# Patient Record
Sex: Male | Born: 1985 | Hispanic: Yes | Marital: Single
Health system: Southern US, Community
[De-identification: ages and names within clinical notes are randomized; demographics above are authoritative.]

---

## 2020-11-07 ENCOUNTER — Emergency Department (HOSPITAL_COMMUNITY)
Admission: EM | Admit: 2020-11-07 | Discharge: 2020-11-07 | Disposition: A | Discharge status: Home / Self Care | Payer: Self-pay | Attending: Emergency Medicine | Admitting: Emergency Medicine

## 2020-11-07 ENCOUNTER — Emergency Department (HOSPITAL_COMMUNITY): Payer: Self-pay

## 2020-11-07 ENCOUNTER — Other Ambulatory Visit: Payer: Self-pay

## 2020-11-07 DIAGNOSIS — W208XXA Other cause of strike by thrown, projected or falling object, initial encounter: Secondary | ICD-10-CM | POA: Insufficient documentation

## 2020-11-07 DIAGNOSIS — S99922A Unspecified injury of left foot, initial encounter: Secondary | ICD-10-CM | POA: Insufficient documentation

## 2020-11-07 DIAGNOSIS — M79672 Pain in left foot: Secondary | ICD-10-CM

## 2020-11-07 DIAGNOSIS — S99929A Unspecified injury of unspecified foot, initial encounter: Secondary | ICD-10-CM

## 2020-11-07 MED ORDER — NAPROXEN 375 MG PO TABS: 375.0000 mg | ORAL_TABLET | Freq: Two times a day (BID) | ORAL | 0 refills | Status: AC

## 2020-11-07 MED ORDER — OXYCODONE-ACETAMINOPHEN 5-325 MG PO TABS
1.0000 | ORAL_TABLET | Freq: Once | ORAL | Status: AC
  Administered 2020-11-07: 1 via ORAL
  Filled 2020-11-07: qty 1

## 2020-11-07 NOTE — ED Triage Notes (Signed)
Pt presents from home, c/o L foot pain after a transmission fell on it on Friday. States it has become more painful and difficult to bear weight

## 2020-11-07 NOTE — ED Provider Notes (Signed)
[facility]-EMERGENCY DEPT Provider Note   CSN: [id number] Arrival date & time: 11/07/20  2044     History Chief Complaint  Patient presents with  . Foot Injury    Seth Hines is a 35 y.o. male.  35 year old male with no past medical history presents to the ED with a chief complaint of left foot pain status post 200 pound transmission falling on his left foot.  There is pain with weightbearing, has tried over-the-counter medication without improvement in symptoms.  Pain seems to be on the dorsum aspect.  Last tetanus immunization is unknown.  The history is provided by the patient.  Foot Injury Location:  Foot Time since incident:  3 days Injury: yes   Mechanism of injury: crush   Crush:    Mechanism:  Falling object   Duration of crushing force:  10 seconds Foot location:  L foot Pain details:    Quality:  Shooting   Radiates to:  Does not radiate   Severity:  Moderate   Duration:  3 days   Timing:  Constant   Progression:  Worsening Chronicity:  New Dislocation: no   Tetanus status:  Unknown Prior injury to area:  No Relieved by:  Nothing Worsened by:  Bearing weight Ineffective treatments:  Acetaminophen, ice and immobilization Associated symptoms: swelling   Associated symptoms: no fever, no muscle weakness, no neck pain and no numbness   Risk factors: no concern for non-accidental trauma and no frequent fractures        No past medical history on file.  There are no problems to display for this patient.     No family history on file.     Home Medications Prior to Admission medications   Medication Sig Start Date End Date Taking? Authorizing Provider  naproxen (NAPROSYN) 375 MG tablet Take 1 tablet (375 mg total) by mouth 2 (two) times daily for 7 days. 11/07/20 11/14/20 Yes Claude Manges, PA-C    Allergies    Patient has no known allergies.  Review of Systems   Review of Systems  Constitutional: Negative for fever.   Musculoskeletal: Positive for arthralgias. Negative for neck pain.    Physical Exam Updated Vital Signs BP (!) 158/92 (BP Location: Left Arm)   Pulse 96   Temp 98 F (36.7 C) (Oral)   Resp 16   Ht 5\' 7"  (1.702 m)   Wt 122.5 kg   SpO2 96%   BMI 42.29 kg/m   Physical Exam Vitals and nursing note reviewed.  Constitutional:      Appearance: Normal appearance.  HENT:     Head: Normocephalic and atraumatic.     Nose: Nose normal.     Mouth/Throat:     Mouth: Mucous membranes are moist.  Eyes:     Pupils: Pupils are equal, round, and reactive to light.  Cardiovascular:     Rate and Rhythm: Normal rate.     Pulses:          Dorsalis pedis pulses are 2+ on the left side.       Posterior tibial pulses are 2+ on the left side.  Pulmonary:     Effort: Pulmonary effort is normal.  Abdominal:     General: Abdomen is flat.  Musculoskeletal:        General: Tenderness and deformity present.     Cervical back: Normal range of motion and neck supple.  Feet:     Comments: Swelling to the dorsum aspect.  Pulses are  present and equal.  Capillary refill is intact, there is pain with plantar flexion. Skin:    General: Skin is warm.     Findings: Erythema present.  Neurological:     Mental Status: He is alert and oriented to person, place, and time.     ED Results / Procedures / Treatments   Labs (all labs ordered are listed, but only abnormal results are displayed) Labs Reviewed - No data to display  EKG None  Radiology DG Foot Complete Left  Result Date: 11/07/2020 CLINICAL DATA:  Left foot crush injury EXAM: LEFT FOOT - COMPLETE 3+ VIEW COMPARISON:  None. FINDINGS: There is no evidence of fracture or dislocation. There is no evidence of arthropathy or other focal bone abnormality. Soft tissues are unremarkable. IMPRESSION: No fracture of the left foot. Electronically Signed   By: Deatra Robinson M.D.   On: 11/07/2020 21:15    Procedures Procedures   Medications Ordered in  ED Medications  oxyCODONE-acetaminophen (PERCOCET/ROXICET) 5-325 MG per tablet 1 tablet (has no administration in time range)    ED Course  I have reviewed the triage vital signs and the nursing notes.  Pertinent labs & imaging results that were available during my care of the patient were reviewed by me and considered in my medical decision making (see chart for details).    MDM Rules/Calculators/A&P     Patient with no pertinent past medical history presents to the ED with chief complaint of left foot pain status post transmission 200 pounds falling on his left foot 3 days ago.  Has tried over-the-counter medication without improvement.  There is swelling noted to the dorsum aspect, skin is intact without any lacerations abrasions noted.  He reports the pain is exacerbated with ambulation along with weightbearing.  He is neurovascularly intact, and his immunization is unknown, no cuts in the skin on today's visit.  Xray of the left foot showed: No fracture of the left foot.  Patient was placed in a postop shoe, we discussed treatment with rice therapy along with follow-up with orthopedics.  He is agreeable of this at this time.  Vitals within normal limits, return precautions discussed only.   Portions of this note were generated with Scientist, clinical (histocompatibility and immunogenetics). Dictation errors may occur despite best attempts at proofreading.  Final Clinical Impression(s) / ED Diagnoses Final diagnoses:  Foot injury  Left foot pain    Rx / DC Orders ED Discharge Orders         Ordered    naproxen (NAPROSYN) 375 MG tablet  2 times daily        11/07/20 2211           Claude Manges, PA-C 11/07/20 2212    Rolan Bucco, MD 11/07/20 2245

## 2020-11-07 NOTE — Discharge Instructions (Signed)
Your x-ray did not show any fracture to your foot.  I have prescribed a short course of anti-inflammatories to help with the pain, please take this as prescribed.  You may apply ice and heat to the area along with elevation.  The number to Dr. Magnus Ivan is attached to your chart, please schedule an appointment for further management.

## 2021-03-21 ENCOUNTER — Ambulatory Visit (HOSPITAL_COMMUNITY): Payer: Self-pay | Admitting: Licensed Clinical Social Worker

## 2022-06-11 IMAGING — CR DG FOOT COMPLETE 3+V*L*
3 series · 3 of 3 positions shown · non-contrast
Comparison: None.

CLINICAL DATA: Left foot crush injury

EXAM:
LEFT FOOT - COMPLETE 3+ VIEW

[x foot ap left]
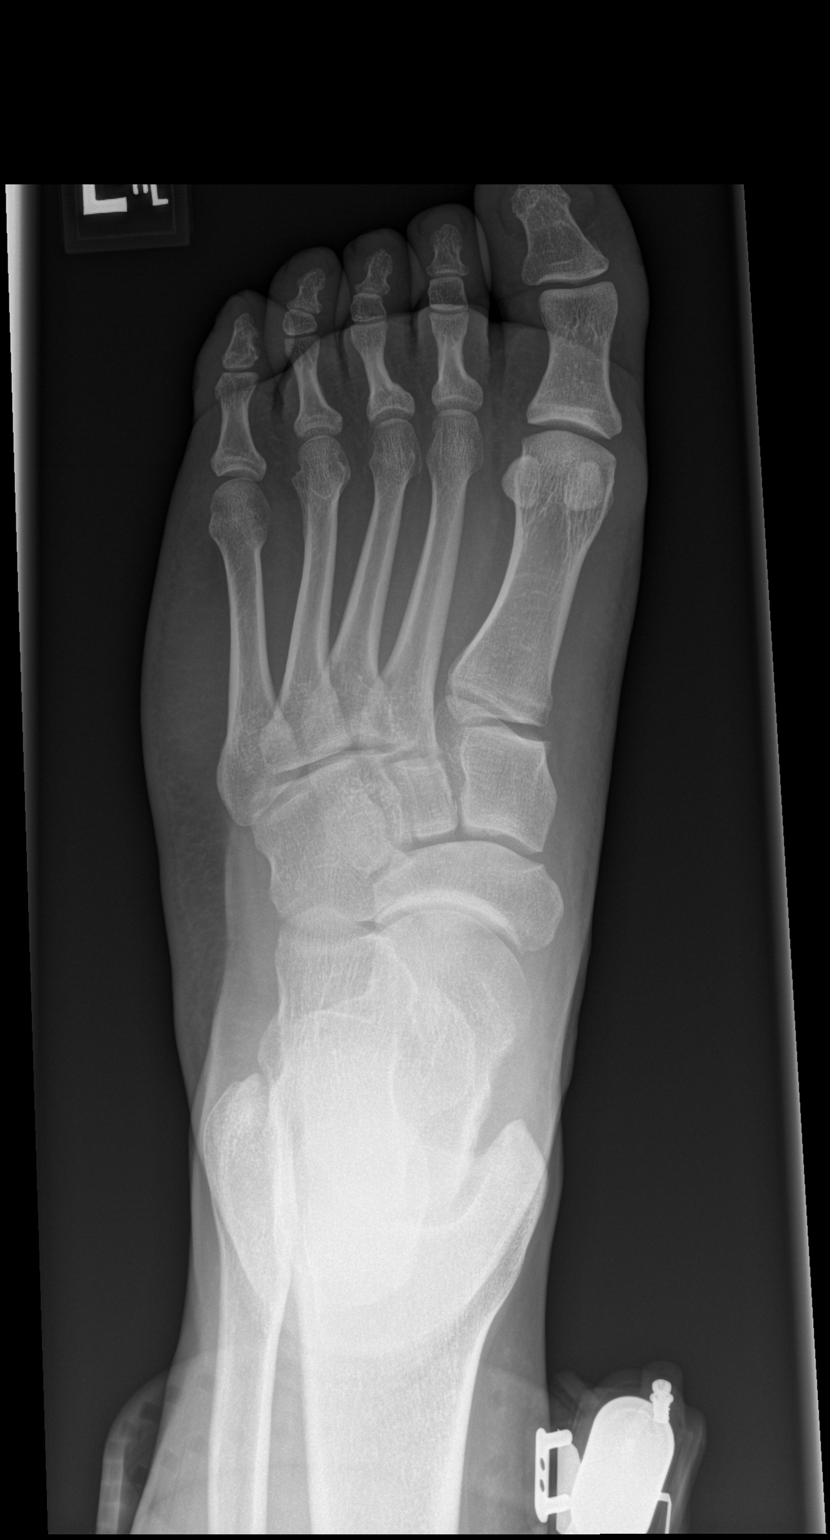

[x foot obl left]
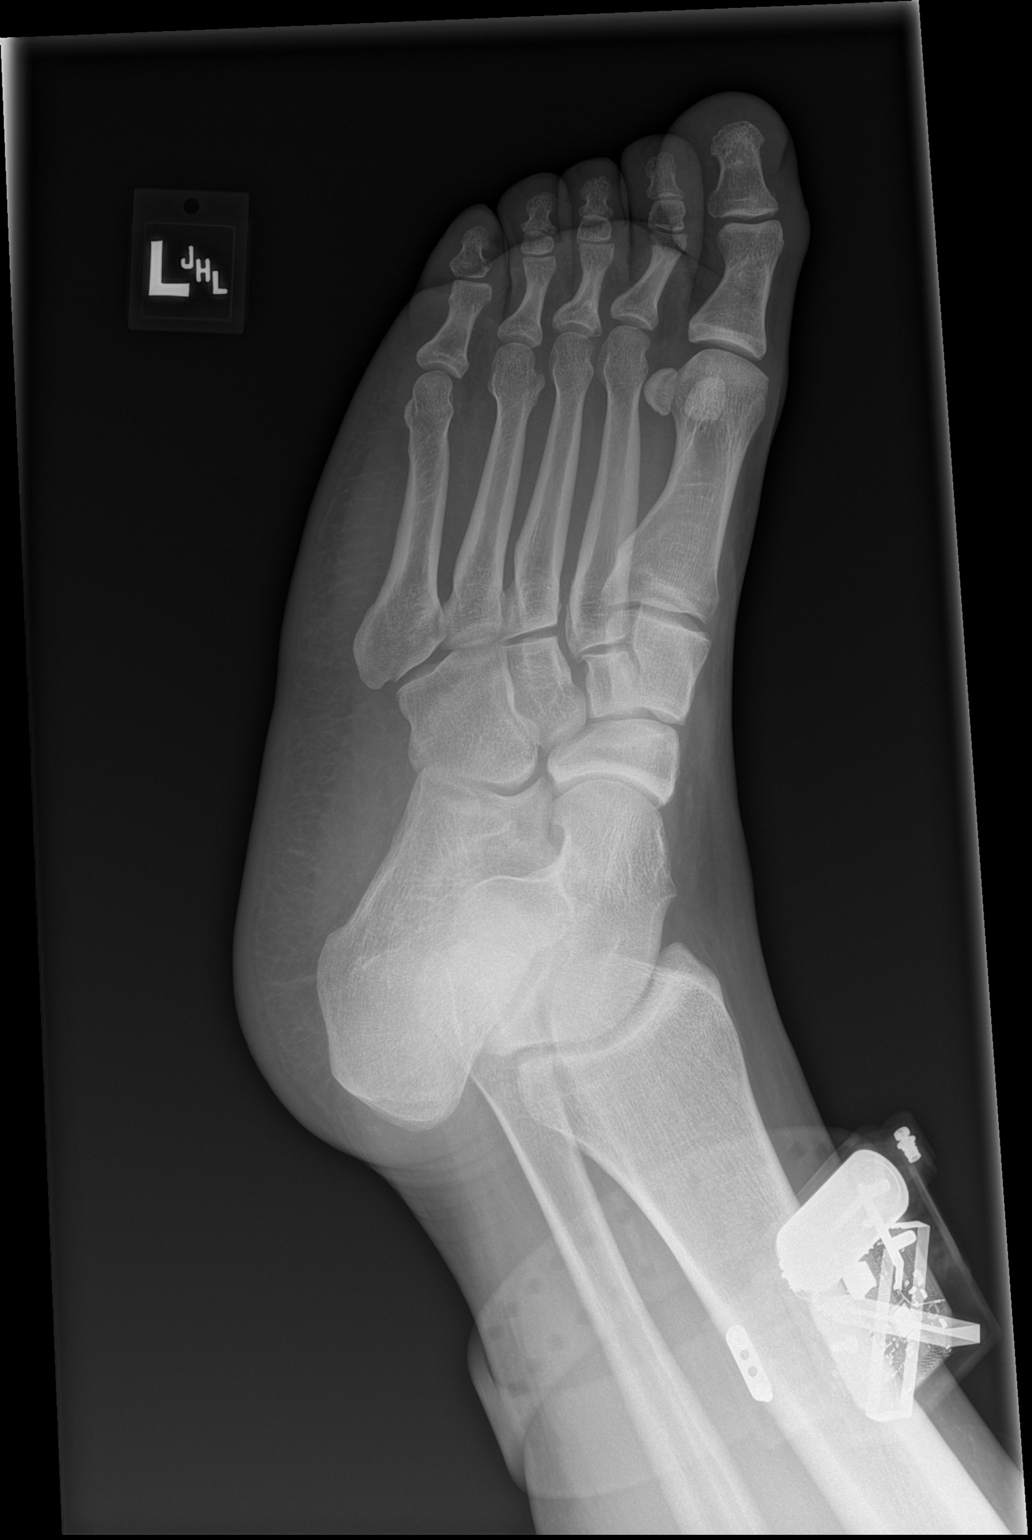

[x foot lat left]
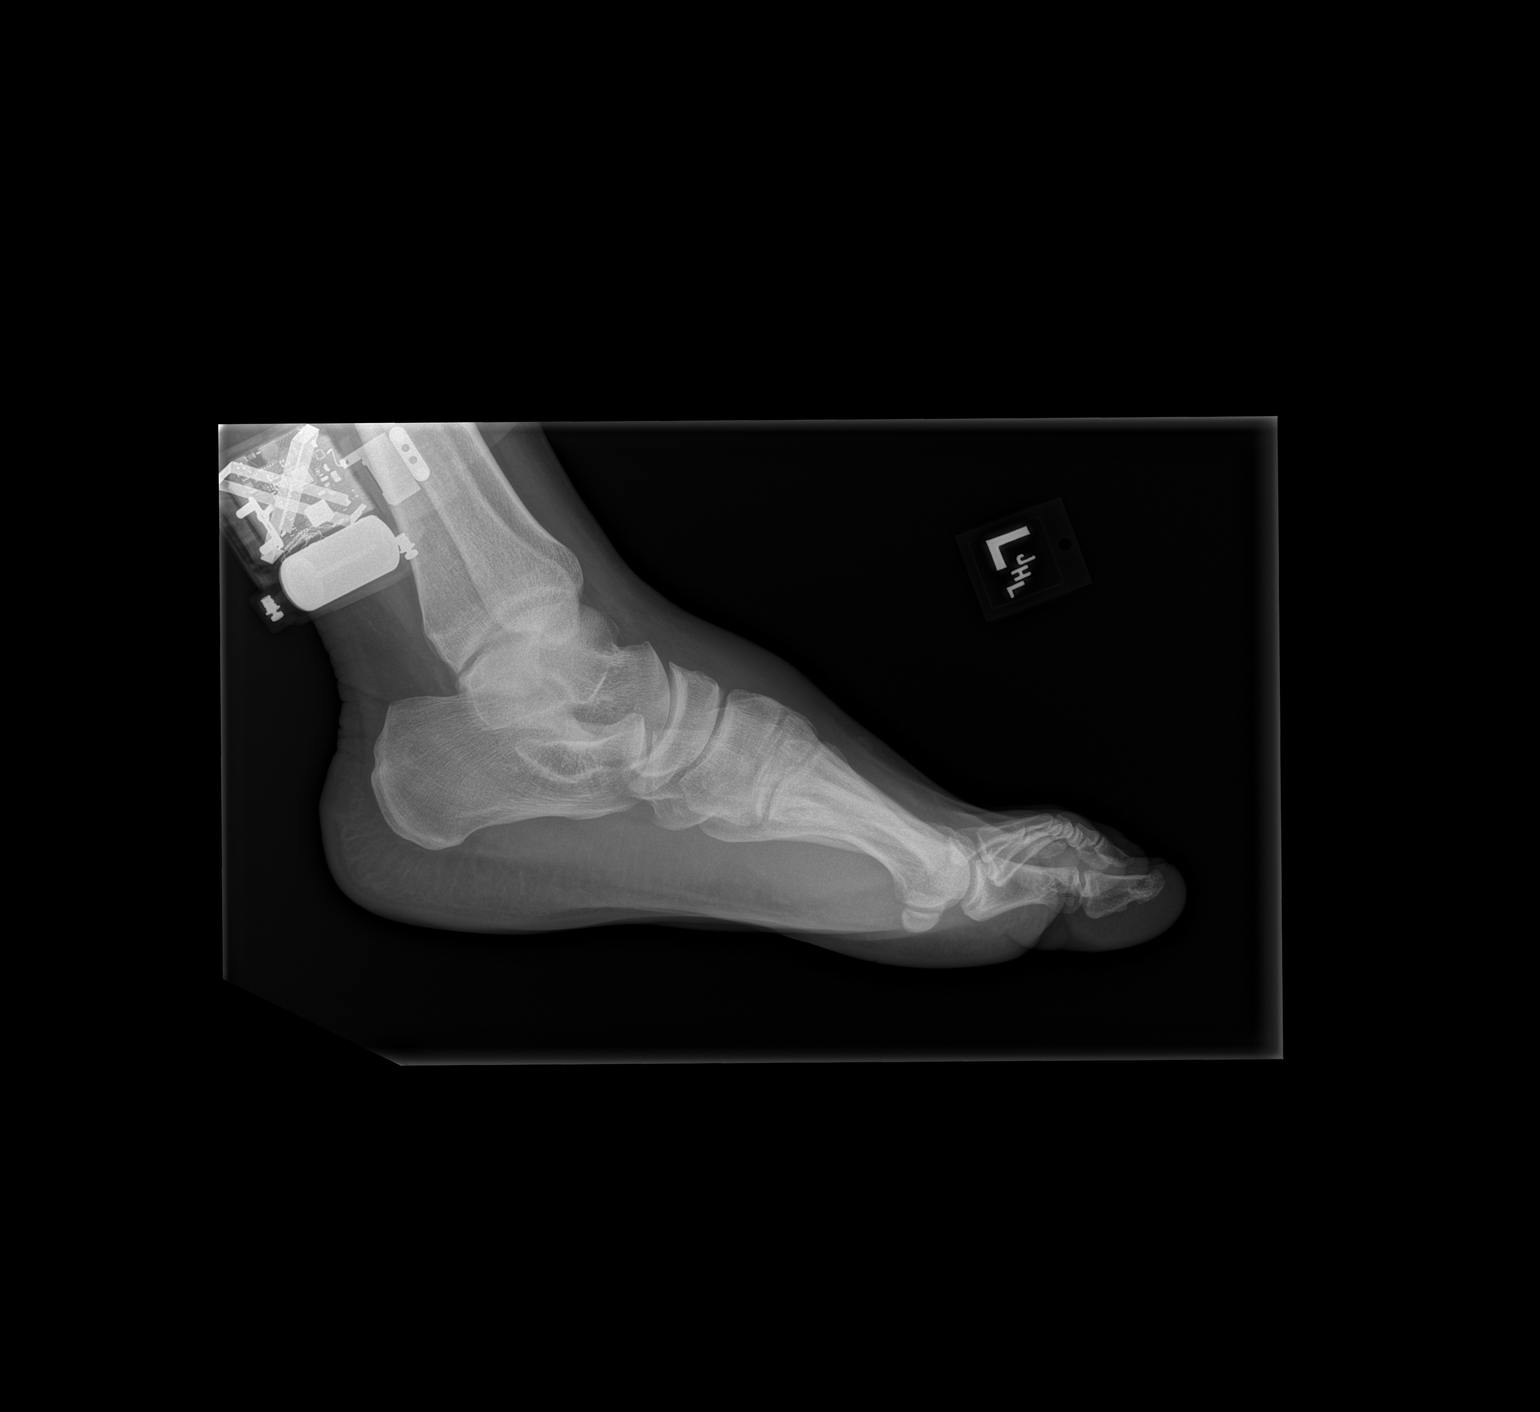

[3 of 3 positions shown; findings below may reference images not displayed]

FINDINGS: There is no evidence of fracture or dislocation. There is no
evidence of arthropathy or other focal bone abnormality. Soft
tissues are unremarkable.
IMPRESSION: No fracture of the left foot.
